# Patient Record
Sex: Male | Born: 1970 | Race: Black or African American | Hispanic: No | Marital: Single | Smoking: Current every day smoker
Health system: Southern US, Community
[De-identification: ages and names within clinical notes are randomized; demographics above are authoritative.]

## PROBLEM LIST (undated history)

## (undated) HISTORY — PX: OTHER SURGICAL HISTORY: SHX169

---

## 2011-06-09 ENCOUNTER — Emergency Department (INDEPENDENT_AMBULATORY_CARE_PROVIDER_SITE_OTHER): Payer: No Typology Code available for payment source

## 2011-06-09 ENCOUNTER — Encounter: Payer: Self-pay | Admitting: *Deleted

## 2011-06-09 ENCOUNTER — Emergency Department (HOSPITAL_BASED_OUTPATIENT_CLINIC_OR_DEPARTMENT_OTHER)
Admission: EM | Admit: 2011-06-09 | Discharge: 2011-06-09 | Disposition: A | Discharge status: Home / Self Care | Payer: No Typology Code available for payment source | Attending: Emergency Medicine | Admitting: Emergency Medicine

## 2011-06-09 DIAGNOSIS — Y92410 Unspecified street and highway as the place of occurrence of the external cause: Secondary | ICD-10-CM | POA: Insufficient documentation

## 2011-06-09 DIAGNOSIS — S139XXA Sprain of joints and ligaments of unspecified parts of neck, initial encounter: Secondary | ICD-10-CM | POA: Insufficient documentation

## 2011-06-09 DIAGNOSIS — M25519 Pain in unspecified shoulder: Secondary | ICD-10-CM

## 2011-06-09 DIAGNOSIS — Z043 Encounter for examination and observation following other accident: Secondary | ICD-10-CM

## 2011-06-09 DIAGNOSIS — S161XXA Strain of muscle, fascia and tendon at neck level, initial encounter: Secondary | ICD-10-CM

## 2011-06-09 MED ORDER — HYDROCODONE-ACETAMINOPHEN 5-500 MG PO TABS: 1.0000 | ORAL_TABLET | Freq: Four times a day (QID) | ORAL | Status: AC | PRN

## 2011-06-09 MED ORDER — IBUPROFEN 800 MG PO TABS: 800.0000 mg | ORAL_TABLET | Freq: Three times a day (TID) | ORAL | Status: AC
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
## 2011-06-12 NOTE — ED Provider Notes (Signed)
Medical screening examination/treatment/procedure(s) were performed by non-physician practitioner and as supervising physician I was immediately available for consultation/collaboration.  Doug Sou, MD 06/12/11 [phone]

## 2011-06-21 ENCOUNTER — Emergency Department (HOSPITAL_BASED_OUTPATIENT_CLINIC_OR_DEPARTMENT_OTHER)
Admission: EM | Admit: 2011-06-21 | Discharge: 2011-06-21 | Disposition: A | Discharge status: Home / Self Care | Payer: Worker's Compensation | Attending: Emergency Medicine | Admitting: Emergency Medicine

## 2011-06-21 ENCOUNTER — Encounter (HOSPITAL_BASED_OUTPATIENT_CLINIC_OR_DEPARTMENT_OTHER): Payer: Self-pay | Admitting: *Deleted

## 2011-06-21 DIAGNOSIS — T20019A Burn of unspecified degree of unspecified ear [any part, except ear drum], initial encounter: Secondary | ICD-10-CM | POA: Insufficient documentation

## 2011-06-21 DIAGNOSIS — IMO0002 Reserved for concepts with insufficient information to code with codable children: Secondary | ICD-10-CM | POA: Insufficient documentation

## 2011-06-21 DIAGNOSIS — Y9289 Other specified places as the place of occurrence of the external cause: Secondary | ICD-10-CM | POA: Insufficient documentation

## 2011-06-21 DIAGNOSIS — T2000XA Burn of unspecified degree of head, face, and neck, unspecified site, initial encounter: Secondary | ICD-10-CM

## 2011-06-21 DIAGNOSIS — T280XXA Burn of mouth and pharynx, initial encounter: Secondary | ICD-10-CM | POA: Insufficient documentation

## 2011-06-21 DIAGNOSIS — T2004XA Burn of unspecified degree of nose (septum), initial encounter: Secondary | ICD-10-CM | POA: Insufficient documentation

## 2011-06-21 MED ORDER — CEPHALEXIN 500 MG PO CAPS: 500.0000 mg | ORAL_CAPSULE | Freq: Four times a day (QID) | ORAL | Status: AC

## 2011-06-21 MED ORDER — TETANUS-DIPHTH-ACELL PERTUSSIS 5-2.5-18.5 LF-MCG/0.5 IM SUSP
0.5000 mL | Freq: Once | INTRAMUSCULAR | Status: AC
  Administered 2011-06-21: 0.5 mL via INTRAMUSCULAR
  Filled 2011-06-21: qty 0.5

## 2011-06-21 MED ORDER — BACITRACIN ZINC 500 UNIT/GM EX OINT: TOPICAL_OINTMENT | Freq: Two times a day (BID) | CUTANEOUS | Status: AC
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
## 2011-06-21 NOTE — ED Provider Notes (Signed)
Medical screening examination/treatment/procedure(s) were performed by non-physician practitioner and as supervising physician I was immediately available for consultation/collaboration.   Melanie Belfi, MD 06/21/11 2351 

## 2012-01-18 ENCOUNTER — Emergency Department (INDEPENDENT_AMBULATORY_CARE_PROVIDER_SITE_OTHER): Payer: Worker's Compensation

## 2012-01-18 ENCOUNTER — Encounter (HOSPITAL_BASED_OUTPATIENT_CLINIC_OR_DEPARTMENT_OTHER): Payer: Self-pay | Admitting: *Deleted

## 2012-01-18 ENCOUNTER — Emergency Department (HOSPITAL_BASED_OUTPATIENT_CLINIC_OR_DEPARTMENT_OTHER)
Admission: EM | Admit: 2012-01-18 | Discharge: 2012-01-18 | Disposition: A | Discharge status: Home / Self Care | Payer: Worker's Compensation | Attending: Emergency Medicine | Admitting: Emergency Medicine

## 2012-01-18 DIAGNOSIS — X500XXA Overexertion from strenuous movement or load, initial encounter: Secondary | ICD-10-CM | POA: Insufficient documentation

## 2012-01-18 DIAGNOSIS — W19XXXA Unspecified fall, initial encounter: Secondary | ICD-10-CM

## 2012-01-18 DIAGNOSIS — M25473 Effusion, unspecified ankle: Secondary | ICD-10-CM | POA: Insufficient documentation

## 2012-01-18 DIAGNOSIS — S99929A Unspecified injury of unspecified foot, initial encounter: Secondary | ICD-10-CM

## 2012-01-18 DIAGNOSIS — M79609 Pain in unspecified limb: Secondary | ICD-10-CM

## 2012-01-18 DIAGNOSIS — S8990XA Unspecified injury of unspecified lower leg, initial encounter: Secondary | ICD-10-CM

## 2012-01-18 DIAGNOSIS — Y990 Civilian activity done for income or pay: Secondary | ICD-10-CM | POA: Insufficient documentation

## 2012-01-18 DIAGNOSIS — M25579 Pain in unspecified ankle and joints of unspecified foot: Secondary | ICD-10-CM

## 2012-01-18 DIAGNOSIS — M25476 Effusion, unspecified foot: Secondary | ICD-10-CM | POA: Insufficient documentation

## 2012-01-18 DIAGNOSIS — M25569 Pain in unspecified knee: Secondary | ICD-10-CM | POA: Insufficient documentation

## 2012-01-18 DIAGNOSIS — F17200 Nicotine dependence, unspecified, uncomplicated: Secondary | ICD-10-CM | POA: Insufficient documentation

## 2012-01-18 DIAGNOSIS — R262 Difficulty in walking, not elsewhere classified: Secondary | ICD-10-CM | POA: Insufficient documentation

## 2012-01-18 DIAGNOSIS — S93409A Sprain of unspecified ligament of unspecified ankle, initial encounter: Secondary | ICD-10-CM | POA: Insufficient documentation

## 2012-01-18 MED ORDER — IBUPROFEN 800 MG PO TABS
800.0000 mg | ORAL_TABLET | Freq: Once | ORAL | Status: AC
  Administered 2012-01-18: 800 mg via ORAL
  Filled 2012-01-18: qty 1

## 2012-01-18 MED ORDER — HYDROCODONE-ACETAMINOPHEN 5-325 MG PO TABS: ORAL_TABLET | ORAL | Status: AC

## 2012-01-18 MED ORDER — HYDROCODONE-ACETAMINOPHEN 5-325 MG PO TABS
2.0000 | ORAL_TABLET | Freq: Once | ORAL | Status: DC
  Filled 2012-01-18: qty 2

## 2012-01-18 NOTE — ED Notes (Signed)
Pt was at work when he rolled his left ankle, +PMS to extremity, some swelling noted to outer aspect of left foot area. Pt is able to ambulate.

## 2012-01-18 NOTE — ED Provider Notes (Signed)
History     CSN: [id number]  Arrival date & time 01/18/12  0142   First MD Initiated Contact with Patient 01/18/12 0149      Chief Complaint  Patient presents with  . Ankle Pain    (Consider location/radiation/quality/duration/timing/severity/associated sxs/prior treatment) HPI Comments: Patient reports while at work he stepped into a little bit of a hole and when he tried to wiggle free the left foot that was in a hole buckled and turned inward with resultant pain at the base of his left ankle in the side of his foot. He reports pain is worse when he tries to bear weight on that area and has been tried to bear weight on the medial portion of his foot. He denies any hip or knee pain. He denies falling or any other injuries. He denies numbness or weakness.  Patient is a 41 y.o. male presenting with ankle pain. The history is provided by the patient.  Ankle Pain  Pertinent negatives include no numbness.    History reviewed. No pertinent past medical history.  Past Surgical History  Procedure Date  . None     History reviewed. No pertinent family history.  History  Substance Use Topics  . Smoking status: Current Everyday Smoker -- 1.0 packs/day  . Smokeless tobacco: Not on file  . Alcohol Use: Yes      Review of Systems  Constitutional: Negative.   Musculoskeletal: Positive for joint swelling and arthralgias.  Skin: Negative for wound.  Neurological: Negative for weakness and numbness.    Allergies  Review of patient's allergies indicates no known allergies.  Home Medications   Current Outpatient Rx  Name Route Sig Dispense Refill  . AMOXICILLIN 250 MG PO CAPS Oral Take 250 mg by mouth 3 (three) times daily.      Marland Kitchen DIPHENHYDRAMINE HCL 25 MG PO CAPS Oral Take 50 mg by mouth every 6 (six) hours as needed. Allergic reaction     . HYDROCODONE-ACETAMINOPHEN 5-325 MG PO TABS  1- 2 tablets po q 6 hours prn moderate to severe pain 15 tablet 0    BP 150/81  Pulse 92   Temp(Src) 97.9 F (36.6 C) (Oral)  Resp 16  SpO2 98%  Physical Exam  Nursing note and vitals reviewed. Constitutional: He appears well-developed and well-nourished. No distress.  HENT:  Head: Normocephalic and atraumatic.  Neck: Normal range of motion. Neck supple.  Musculoskeletal:       Left ankle: Achilles tendon normal.       Legs:      Feet:  Neurological: He is alert.  Skin: Skin is warm and dry. No rash noted. No pallor.    ED Course  Procedures (including critical care time)  Labs Reviewed - No data to display Dg Ankle Complete Left  01/18/2012  *RADIOLOGY REPORT*  Clinical Data: Status post fall; left ankle and foot injury, with lateral ankle pain, radiating to the base of the fifth metatarsal.  LEFT ANKLE COMPLETE - 3+ VIEW  Comparison: None.  Findings: There is no evidence of fracture or dislocation.  The ankle mortise is intact; the interosseous space is within normal limits.  No talar tilt or subluxation is seen.  The joint spaces are preserved.  No significant soft tissue abnormalities are seen.  IMPRESSION: No evidence of fracture or dislocation.  Original Report Authenticated By: Tonia Ghent, M.D.   Dg Foot Complete Left  01/18/2012  *RADIOLOGY REPORT*  Clinical Data: Status post fall, with injury to left ankle and  left foot.  Left lateral malleolar pain radiates to the base of the fifth metatarsal.  LEFT FOOT - COMPLETE 3+ VIEW  Comparison: None.  Findings: There is no evidence of fracture or dislocation.  The joint spaces are preserved.  There is no evidence of talar subluxation; the subtalar joint is unremarkable in appearance.  An os peroneum is noted.  Soft tissue swelling is noted about the lateral aspect of the midfoot and hindfoot.  IMPRESSION:  1.  No evidence of fracture or dislocation. 2.  Os peroneum noted.  Original Report Authenticated By: Tonia Ghent, M.D.   I reviewed the above films myself.  1. Ankle sprain and strain       MDM  Patient  likely with ankle sprain by exam. Plain films shows no fractures. Patient is treated with rest, ice, elevation and NSAIDs. Patient is given a prescription for analgesics. Patient is also given Ace wrap and crutches to use as needed.        Gavin Pound. Oletta Lamas, MD 01/18/12 1610
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
## 2012-01-18 NOTE — Discharge Instructions (Signed)
Ankle Sprain An ankle sprain is an injury to the strong, fibrous tissues (ligaments) that hold the bones of your ankle joint together.  CAUSES Ankle sprain usually is caused by a fall or by twisting your ankle. People who participate in sports are more prone to these types of injuries.  SYMPTOMS  Symptoms of ankle sprain include:  Pain in your ankle. The pain may be present at rest or only when you are trying to stand or walk.   Swelling.   Bruising. Bruising may develop immediately or within 1 to 2 days after your injury.   Difficulty standing or walking.  DIAGNOSIS  Your caregiver will ask you details about your injury and perform a physical exam of your ankle to determine if you have an ankle sprain. During the physical exam, your caregiver will press and squeeze specific areas of your foot and ankle. Your caregiver will try to move your ankle in certain ways. An X-ray exam may be done to be sure a bone was not broken or a ligament did not separate from one of the bones in your ankle (avulsion).  TREATMENT  Certain types of braces can help stabilize your ankle. Your caregiver can make a recommendation for this. Your caregiver may recommend the use of medication for pain. If your sprain is severe, your caregiver may refer you to a surgeon who helps to restore function to parts of your skeletal system (orthopedist) or a physical therapist. HOME CARE INSTRUCTIONS  Apply ice to your injury for 1 to 2 days or as directed by your caregiver. Applying ice helps to reduce inflammation and pain.  Put ice in a plastic bag.   Place a towel between your skin and the bag.   Leave the ice on for 15 to 20 minutes at a time, every 2 hours while you are awake.   Take over-the-counter or prescription medicines for pain, discomfort, or fever only as directed by your caregiver.   Keep your injured leg elevated, when possible, to lessen swelling.   If your caregiver recommends crutches, use them as  instructed. Gradually, put weight on the affected ankle. Continue to use crutches or a cane until you can walk without feeling pain in your ankle.   If you have a plaster splint, wear the splint as directed by your caregiver. Do not rest it on anything harder than a pillow the first 24 hours. Do not put weight on it. Do not get it wet. You may take it off to take a shower or bath.   You may have been given an elastic bandage to wear around your ankle to provide support. If the elastic bandage is too tight (you have numbness or tingling in your foot or your foot becomes cold and blue), adjust the bandage to make it comfortable.   If you have an air splint, you may blow more air into it or let air out to make it more comfortable. You may take your splint off at night and before taking a shower or bath.   Wiggle your toes in the splint several times per day if you are able.  SEEK MEDICAL CARE IF:   You have an increase in bruising, swelling, or pain.   Your toes feel cold.   Pain relief is not achieved with medication.  SEEK IMMEDIATE MEDICAL CARE IF: Your toes are numb or blue or you have severe pain. MAKE SURE YOU:   Understand these instructions.   Will watch your condition.     Will get help right away if you are not doing well or get worse.  Document Released: 10/09/2005 Document Revised: 09/28/2011 Document Reviewed: 05/13/2008 [facility] Patient Information 2012 [place], [place].   Narcotic and benzodiazepine use may cause drowsiness, slowed breathing or dependence.  Please use with caution and do not drive, operate machinery or watch young children alone while taking them.  Taking combinations of these medications or drinking alcohol will potentiate these effects.

## 2012-08-25 IMAGING — CR DG FOOT COMPLETE 3+V*L*
3 series · 3 of 3 positions shown · non-contrast
Comparison: None.

CLINICAL DATA: Status post fall, with injury to left ankle and left
foot.  Left lateral malleolar pain radiates to the base of the
fifth metatarsal.

LEFT FOOT - COMPLETE 3+ VIEW

[t foot ap left]
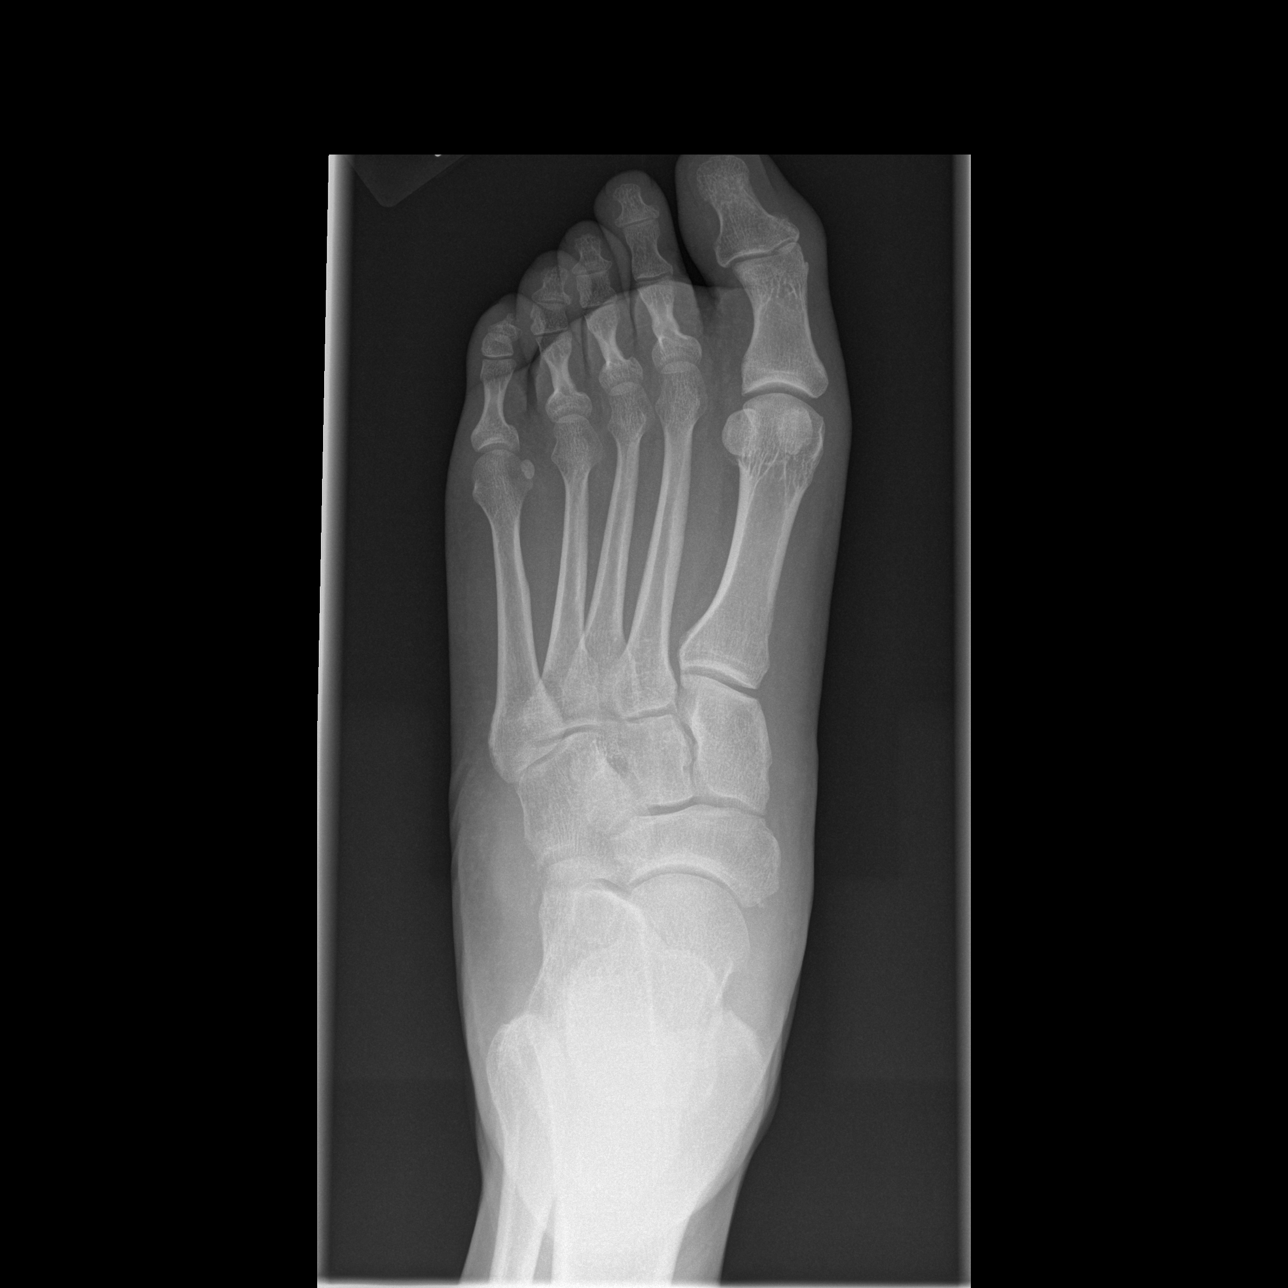

[t foot oblique left]
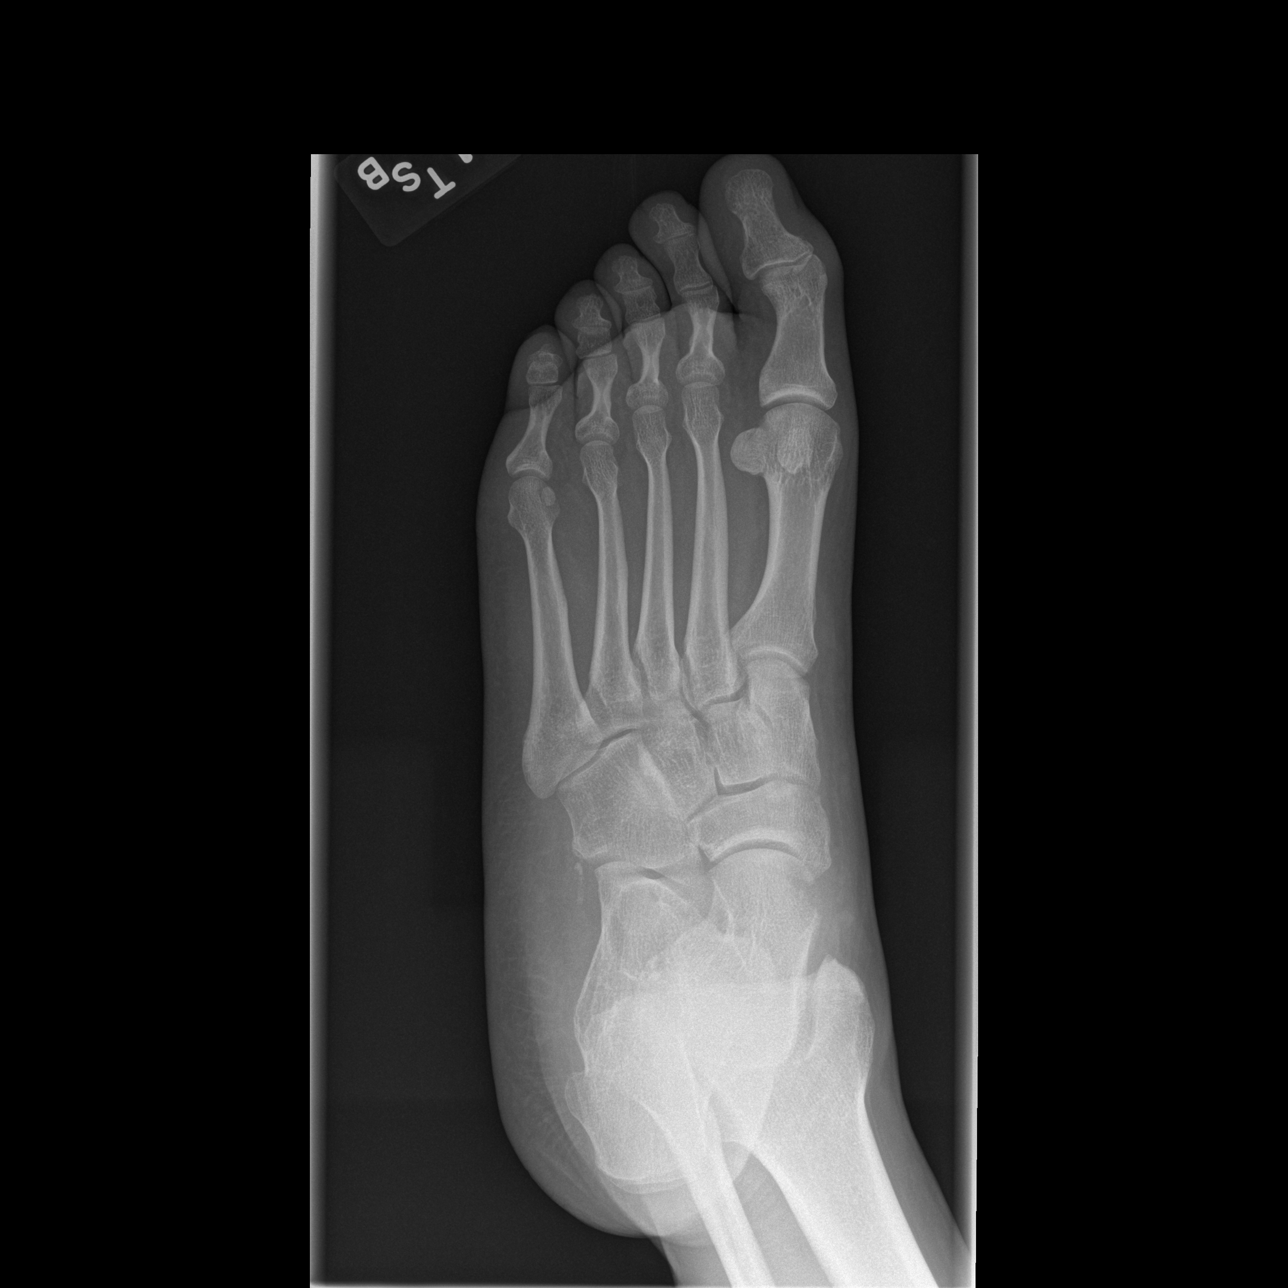

[t foot lat left]
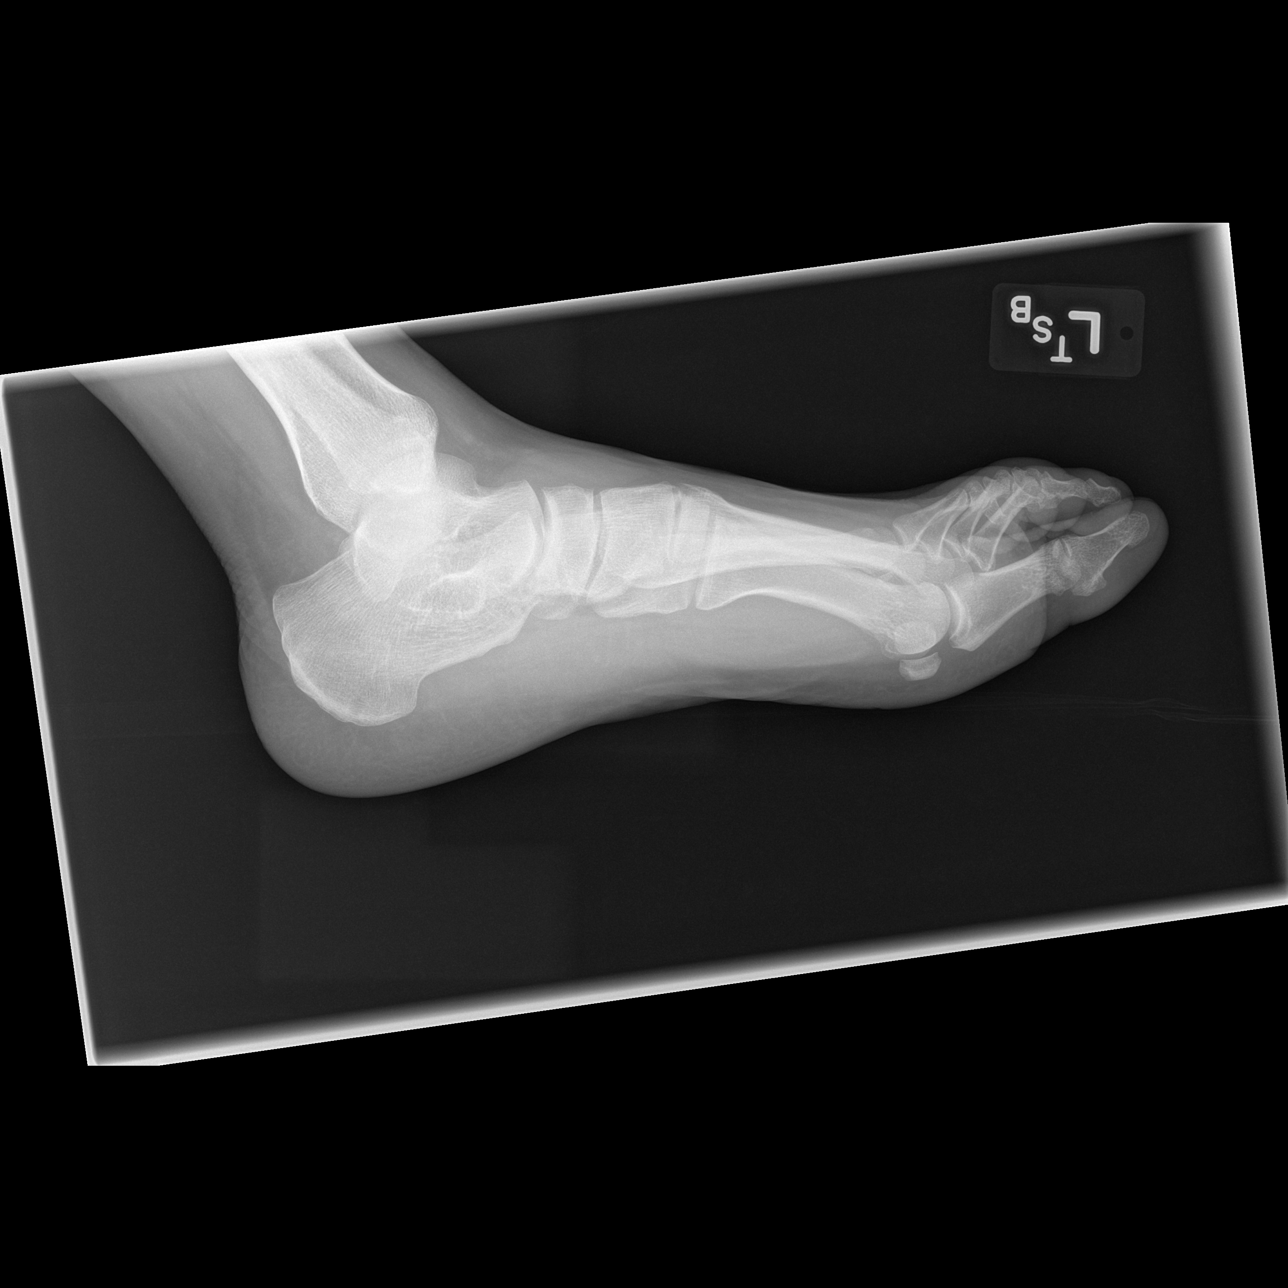

[3 of 3 positions shown; findings below may reference images not displayed]

FINDINGS: There is no evidence of fracture or dislocation.  The
joint spaces are preserved.  There is no evidence of talar
subluxation; the subtalar joint is unremarkable in appearance.  An
os peroneum is noted.

Soft tissue swelling is noted about the lateral aspect of the
midfoot and hindfoot.
IMPRESSION: 1.  No evidence of fracture or dislocation.
2.  Os peroneum noted.

## 2013-03-21 ENCOUNTER — Encounter (HOSPITAL_BASED_OUTPATIENT_CLINIC_OR_DEPARTMENT_OTHER): Payer: Self-pay | Admitting: *Deleted

## 2013-03-21 ENCOUNTER — Emergency Department (HOSPITAL_BASED_OUTPATIENT_CLINIC_OR_DEPARTMENT_OTHER)
Admission: EM | Admit: 2013-03-21 | Discharge: 2013-03-21 | Disposition: A | Discharge status: Home / Self Care | Payer: BC Managed Care – PPO | Attending: Emergency Medicine | Admitting: Emergency Medicine

## 2013-03-21 DIAGNOSIS — F17200 Nicotine dependence, unspecified, uncomplicated: Secondary | ICD-10-CM | POA: Insufficient documentation

## 2013-03-21 DIAGNOSIS — L299 Pruritus, unspecified: Secondary | ICD-10-CM | POA: Insufficient documentation

## 2013-03-21 DIAGNOSIS — L259 Unspecified contact dermatitis, unspecified cause: Secondary | ICD-10-CM | POA: Insufficient documentation

## 2013-03-21 DIAGNOSIS — Z792 Long term (current) use of antibiotics: Secondary | ICD-10-CM | POA: Insufficient documentation

## 2013-03-21 MED ORDER — PREDNISONE 10 MG PO TABS: ORAL_TABLET | ORAL | Status: DC

## 2013-03-21 MED ORDER — HYDROXYZINE HCL 25 MG PO TABS: 25.0000 mg | ORAL_TABLET | Freq: Four times a day (QID) | ORAL | Status: DC

## 2013-03-21 NOTE — ED Provider Notes (Signed)
History     CSN: [id number]  Arrival date & time 03/21/13  1524   First MD Initiated Contact with Patient 03/21/13 1546      Chief Complaint  Patient presents with  . Rash    (Consider location/radiation/quality/duration/timing/severity/associated sxs/prior treatment) Patient is a 42 y.o. male presenting with rash. The history is provided by the patient.  Rash Pain location: Bilateral arms, legs, and abdomen. Pain quality: burning   Pain quality comment:  Itching Pain severity:  No pain Onset quality:  Gradual Duration:  2 days Timing:  Constant Progression:  Worsening Chronicity:  New Context comment:  Working outside cutting up fallen tree 2 days prior to onset of rash Relieved by:  Nothing Worsened by:  Nothing tried Associated symptoms: no chest pain, no fever, no shortness of breath and no sore throat     History reviewed. No pertinent past medical history.  Past Surgical History  Procedure Laterality Date  . None      History reviewed. No pertinent family history.  History  Substance Use Topics  . Smoking status: Current Every Day Smoker -- 1.00 packs/day  . Smokeless tobacco: Not on file  . Alcohol Use: Yes      Review of Systems  Constitutional: Negative for fever.  HENT: Negative for sore throat.   Respiratory: Negative for shortness of breath.   Cardiovascular: Negative for chest pain.  Skin: Positive for rash.  All other systems reviewed and are negative.    Allergies  Review of patient's allergies indicates no known allergies.  Home Medications   Current Outpatient Rx  Name  Route  Sig  Dispense  Refill  . amoxicillin (AMOXIL) 250 MG capsule   Oral   Take 250 mg by mouth 3 (three) times daily.           . diphenhydrAMINE (BENADRYL) 25 mg capsule   Oral   Take 50 mg by mouth every 6 (six) hours as needed. Allergic reaction            BP 121/68  Pulse 88  Temp(Src) 98.4 F (36.9 C) (Oral)  Resp 20  SpO2 96%  Physical  Exam  Constitutional: He is oriented to person, place, and time. He appears well-developed and well-nourished. No distress.  HENT:  Head: Normocephalic and atraumatic.  Mouth/Throat: Oropharynx is clear and moist.  Neck: Normal range of motion. Neck supple.  Cardiovascular: Normal rate and regular rhythm.   No murmur heard. Pulmonary/Chest: Effort normal and breath sounds normal. No respiratory distress.  Lymphadenopathy:    He has no cervical adenopathy.  Neurological: He is alert and oriented to person, place, and time.  Skin: Skin is warm and dry. He is not diaphoretic.  There is a macular rash present on the arms, legs, and abdomen.  It is elevated, pruritic.    ED Course  Procedures (including critical care time)  Labs Reviewed - No data to display No results found.   No diagnosis found.    MDM  Looks like contact dermatitis, will treat with prednisone, atarax.          Geoffery Lyons, MD 03/21/13 [phone]

## 2013-03-21 NOTE — ED Notes (Signed)
Pt amb to triage with quick steady gait in nad. Pt reports rash to arms and legs x 2 days.

## 2016-08-02 ENCOUNTER — Encounter (HOSPITAL_BASED_OUTPATIENT_CLINIC_OR_DEPARTMENT_OTHER): Payer: Self-pay

## 2016-08-02 ENCOUNTER — Emergency Department (HOSPITAL_BASED_OUTPATIENT_CLINIC_OR_DEPARTMENT_OTHER)
Admission: EM | Admit: 2016-08-02 | Discharge: 2016-08-02 | Disposition: A | Discharge status: Home / Self Care | Payer: Self-pay | Attending: Physician Assistant | Admitting: Physician Assistant

## 2016-08-02 DIAGNOSIS — Y939 Activity, unspecified: Secondary | ICD-10-CM | POA: Insufficient documentation

## 2016-08-02 DIAGNOSIS — Y999 Unspecified external cause status: Secondary | ICD-10-CM | POA: Insufficient documentation

## 2016-08-02 DIAGNOSIS — Y929 Unspecified place or not applicable: Secondary | ICD-10-CM | POA: Insufficient documentation

## 2016-08-02 DIAGNOSIS — S80862A Insect bite (nonvenomous), left lower leg, initial encounter: Secondary | ICD-10-CM | POA: Insufficient documentation

## 2016-08-02 DIAGNOSIS — F17210 Nicotine dependence, cigarettes, uncomplicated: Secondary | ICD-10-CM | POA: Insufficient documentation

## 2016-08-02 DIAGNOSIS — W57XXXA Bitten or stung by nonvenomous insect and other nonvenomous arthropods, initial encounter: Secondary | ICD-10-CM | POA: Insufficient documentation

## 2016-08-02 MED ORDER — DOXYCYCLINE HYCLATE 100 MG PO TABS
200.0000 mg | ORAL_TABLET | Freq: Once | ORAL | Status: AC
  Administered 2016-08-02: 200 mg via ORAL
  Filled 2016-08-02: qty 2

## 2016-08-02 NOTE — Discharge Instructions (Signed)
We are unsure what bit you.  We gave you one dose of antibtiocs in case it was a tick. Please return if it gets larger, you have any symptoms such as fever nausea vomiting diarrhea or other concerns.

## 2016-08-02 NOTE — ED Triage Notes (Signed)
C/o ?insect bite to left LE yesterday-states area was red with swelling and pus-NAD-steady gait

## 2016-08-02 NOTE — ED Provider Notes (Addendum)
[facility] Provider Note   CSN: [id number] Arrival date & time: 08/02/16  2128   By signing my name below, I, Clovis Pu, attest that this documentation has been prepared under the direction and in the presence of Lakashia Collison Randall An, MD  Electronically Signed: Clovis Pu, ED Scribe. 08/02/16. 10:09 PM.   History   Chief Complaint Chief Complaint  Patient presents with  . Insect Bite    The history is provided by the patient. No language interpreter was used.   HPI Comments:  Tim Lucas is a 45 y.o. male who presents to the Emergency Department complaining of insect bite to his left calf x 1 day. Pt notes he noticed resolved swelling from his ankle up his calf. He notes he has tried applying antiseptic solution and alcohol with relief. Pt notes associated tingling to his toes and burning sensation to his lower extremity. He denies any other complaints at this time.   History reviewed. No pertinent past medical history.  There are no active problems to display for this patient.   Past Surgical History:  Procedure Laterality Date  . none       Home Medications    Prior to Admission medications   Not on File    Family History No family history on file.  Social History Social History  Substance Use Topics  . Smoking status: Current Every Day Smoker    Packs/day: 1.00    Types: Cigarettes  . Smokeless tobacco: Never Used  . Alcohol use Yes     Comment: weekly     Allergies   Review of patient's allergies indicates no known allergies.   Review of Systems Review of Systems  Skin: Positive for wound (insect bite).  All other systems reviewed and are negative.    Physical Exam Updated Vital Signs BP 155/90 (BP Location: Left Arm)   Pulse 94   Temp 98.5 F (36.9 C) (Oral)   Resp 18   Ht 5\' 10"  (1.778 m)   Wt 176 lb (79.8 kg)   SpO2 99%   BMI 25.25 kg/m   Physical Exam  Constitutional: He is oriented to person, place, and  time. He appears well-developed and well-nourished. No distress.  HENT:  Head: Normocephalic and atraumatic.  Eyes: Conjunctivae are normal.  Cardiovascular: Normal rate.   Pulmonary/Chest: Effort normal.  Abdominal: He exhibits no distension.  Neurological: He is alert and oriented to person, place, and time.  Skin: Skin is warm and dry.  Psychiatric: He has a normal mood and affect.  Nursing note and vitals reviewed. leg: punctate spot, 1 cm no tracking, no surrounding erthema. No swelling.   ED Treatments / Results  DIAGNOSTIC STUDIES:  Oxygen Saturation is 99% on RA, normal by my interpretation.    COORDINATION OF CARE:  10:08 PM Discussed treatment plan with pt at bedside and pt agreed to plan.  Labs (all labs ordered are listed, but only abnormal results are displayed) Labs Reviewed - No data to display  EKG  EKG Interpretation None       Radiology No results found.  Procedures Procedures (including critical care time)  Medications Ordered in ED Medications  doxycycline (VIBRA-TABS) tablet 200 mg (not administered)     Initial Impression / Assessment and Plan / ED Course  I have reviewed the triage vital signs and the nursing notes.  Pertinent labs & imaging results that were available during my care of the patient were reviewed by me and considered in my  medical decision making (see chart for details).  Clinical Course    Patient bit by insect on lower leg. 1 cm colored area with pinpoint bite.  Pt reports there was swelling andpain now resolved. Will have him use benadryl, soaks, compresses at home.  He will return with fever, muscle pain/weakness, sob or if it gets any larger.  Since this bite occurred outdoors, and we are in tick season, we'll get a single dose of doxycycline 200 mg to cover for RMSF or Lyme.  PE of leg normal except bug bite, normal vitalas, patient in NAD.   Final Clinical Impressions(s) / ED Diagnoses   Final diagnoses:    Insect bite, initial encounter    New Prescriptions New Prescriptions   No medications on file  I personally performed the services described in this documentation, which was scribed in my presence. The recorded information has been reviewed and is accurate.       Raeven Pint Randall AnLyn Kindell Strada, MD 08/02/16 2223    Draken Farrior Randall AnLyn Adryan Druckenmiller, MD 08/02/16 2241
